# Patient Record
Sex: Female | Born: 1990 | Race: Black or African American | Hispanic: No | Marital: Single | Smoking: Current every day smoker
Health system: Southern US, Community
[De-identification: ages and names within clinical notes are randomized; demographics above are authoritative.]

## PROBLEM LIST (undated history)

## (undated) DIAGNOSIS — Z789 Other specified health status: Secondary | ICD-10-CM

## (undated) HISTORY — PX: INDUCED ABORTION: SHX677

---

## 2013-02-07 ENCOUNTER — Encounter (HOSPITAL_COMMUNITY): Payer: Self-pay | Admitting: *Deleted

## 2013-02-07 ENCOUNTER — Emergency Department (HOSPITAL_COMMUNITY): Payer: No Typology Code available for payment source

## 2013-02-07 ENCOUNTER — Emergency Department (HOSPITAL_COMMUNITY)
Admission: EM | Admit: 2013-02-07 | Discharge: 2013-02-07 | Disposition: A | Discharge status: Home / Self Care | Payer: No Typology Code available for payment source | Attending: Emergency Medicine | Admitting: Emergency Medicine

## 2013-02-07 DIAGNOSIS — Y939 Activity, unspecified: Secondary | ICD-10-CM | POA: Insufficient documentation

## 2013-02-07 DIAGNOSIS — S8990XA Unspecified injury of unspecified lower leg, initial encounter: Secondary | ICD-10-CM | POA: Insufficient documentation

## 2013-02-07 DIAGNOSIS — W19XXXA Unspecified fall, initial encounter: Secondary | ICD-10-CM

## 2013-02-07 DIAGNOSIS — R296 Repeated falls: Secondary | ICD-10-CM | POA: Insufficient documentation

## 2013-02-07 DIAGNOSIS — S99929A Unspecified injury of unspecified foot, initial encounter: Secondary | ICD-10-CM | POA: Insufficient documentation

## 2013-02-07 DIAGNOSIS — Y929 Unspecified place or not applicable: Secondary | ICD-10-CM | POA: Insufficient documentation

## 2013-02-07 MED ORDER — HYDROCODONE-ACETAMINOPHEN 5-325 MG PO TABS: 2.0000 | ORAL_TABLET | ORAL | Status: DC | PRN

## 2013-02-07 MED ORDER — NAPROXEN 500 MG PO TABS: 500.0000 mg | ORAL_TABLET | Freq: Two times a day (BID) | ORAL | Status: DC

## 2013-02-07 NOTE — ED Provider Notes (Signed)
History    This chart was scribed for non-physician practitioner working with Leonette Most B. Bernette Mayers, MD by Donne Anon, ED Scribe. This patient was seen in room WTR7/WTR7 and the patient's care was started at 1628.   CSN: [id number]  Arrival date & time 02/07/13  1628   First MD Initiated Contact with Patient 02/07/13 1831      Chief Complaint  Patient presents with  . Knee Pain     The history is provided by the patient and medical records. No language interpreter was used.   Pamela Wright is a 22 y.o. female who presents to the Emergency Department complaining of sudden onset, non changing, non radiating, moderate knee pain due to a fall that occurred Friday afternoon. She  fell straight on her knee cap. She has been ambulatory without difficulty since the fall, but has been resting it. She reports associated swelling. She denies numbness, tingling, decreased ROM of knee or any other pain. She reports nothing makes the pain worse. She has tried ice, elevation, Advil and rest with moderate relief.    History reviewed. No pertinent past medical history.  History reviewed. No pertinent past surgical history.  History reviewed. No pertinent family history.  History  Substance Use Topics  . Smoking status: Not on file  . Smokeless tobacco: Not on file  . Alcohol Use: Not on file    Review of Systems  Constitutional: Negative for fever, diaphoresis, appetite change, fatigue and unexpected weight change.  HENT: Negative for mouth sores and neck stiffness.   Eyes: Negative for visual disturbance.  Respiratory: Negative for cough, chest tightness, shortness of breath and wheezing.   Cardiovascular: Negative for chest pain.  Gastrointestinal: Negative for nausea, vomiting, abdominal pain, diarrhea and constipation.  Endocrine: Negative for polydipsia, polyphagia and polyuria.  Genitourinary: Negative for dysuria, urgency, frequency and hematuria.  Musculoskeletal: Positive for joint  swelling and arthralgias. Negative for back pain.  Skin: Negative for rash.  Allergic/Immunologic: Negative for immunocompromised state.  Neurological: Negative for syncope, light-headedness and headaches.  Hematological: Does not bruise/bleed easily.  Psychiatric/Behavioral: Negative for sleep disturbance. The patient is not nervous/anxious.   All other systems reviewed and are negative.    Allergies  Penicillins  Home Medications   Current Outpatient Rx  Name  Route  Sig  Dispense  Refill  . ibuprofen (ADVIL,MOTRIN) 200 MG tablet   Oral   Take 200 mg by mouth every 6 (six) hours as needed for pain.         . Multiple Vitamin (MULTIVITAMIN WITH MINERALS) TABS   Oral   Take 1 tablet by mouth daily.         Marland Kitchen HYDROcodone-acetaminophen (NORCO/VICODIN) 5-325 MG per tablet   Oral   Take 2 tablets by mouth every 4 (four) hours as needed for pain.   10 tablet   0   . naproxen (NAPROSYN) 500 MG tablet   Oral   Take 1 tablet (500 mg total) by mouth 2 (two) times daily with a meal.   30 tablet   0     Triage Vitals; BP 118/64  Pulse 82  Temp(Src) 98 F (36.7 C) (Oral)  Resp 20  SpO2 100%  LMP 01/26/2013  Physical Exam  Nursing note and vitals reviewed. Constitutional: She appears well-developed and well-nourished. No distress.  HENT:  Head: Normocephalic and atraumatic.  Mouth/Throat: Oropharynx is clear and moist. No oropharyngeal exudate.  Eyes: Conjunctivae are normal. No scleral icterus.  Neck: Normal range of motion.  Neck supple.  Cardiovascular: Normal rate, regular rhythm, normal heart sounds and intact distal pulses.  Exam reveals no gallop and no friction rub.   No murmur heard. Capillary refill < 3 sec  Pulmonary/Chest: Effort normal and breath sounds normal. No respiratory distress. She has no wheezes.  Abdominal: Soft. Bowel sounds are normal. She exhibits no mass. There is no tenderness. There is no rebound and no guarding.  Musculoskeletal: Normal  range of motion. She exhibits edema.       Right knee: She exhibits swelling, effusion and ecchymosis. She exhibits normal range of motion, no deformity, no laceration, no erythema, normal alignment, no LCL laxity and normal patellar mobility. Tenderness found. Medial joint line tenderness noted.  Good sensation, capillary refill, distal pulses bilaterally. Knee is slightly swollen with mild echinosis over the patella. Medial joint line tenderness of right knee. No pain on palpation of the patella.  Neurological: She is alert. GCS eye subscore is 4. GCS verbal subscore is 5. GCS motor subscore is 6.  Reflex Scores:      Tricep reflexes are 2+ on the right side and 2+ on the left side.      Bicep reflexes are 2+ on the right side and 2+ on the left side.      Brachioradialis reflexes are 2+ on the right side and 2+ on the left side.      Patellar reflexes are 2+ on the right side and 2+ on the left side.      Achilles reflexes are 2+ on the right side and 2+ on the left side. Speech is clear and goal oriented Moves extremities without ataxia Sensation intact Strength 5 out of 5 in the lower kidneys bilaterally  Skin: Skin is warm and dry. She is not diaphoretic.  Psychiatric: She has a normal mood and affect.    ED Course  Procedures (including critical care time) DIAGNOSTIC STUDIES: Oxygen Saturation is 100% on room air, norml by my interpretation.    COORDINATION OF CARE: 7:00 PM Discussed treatment plan which includes antiinflammatory medication, ice, elevation, rest and follow up with an orthopedist with pt at bedside and pt agreed to plan.     Labs Reviewed - No data to display Dg Knee Complete 4 Views Right  02/07/2013  *RADIOLOGY REPORT*  Clinical Data: Fall, knee pain.  RIGHT KNEE - COMPLETE 4+ VIEW  Comparison: None.  Findings: Irregular bony fragment is noted superior to the patella. Cannot exclude small avulsed fragment from superior pole of the patella.  Small joint effusion  present.  No additional acute bony abnormality.  IMPRESSION: Question small avulsed fragments from the superior pole of the patella.  Small joint effusion.   Original Report Authenticated By: Charlett Nose, M.D.      1. Knee injury   2. Fall       MDM  Noralee Space presents for knee pain after fall. X-ray with Question small avulsed fragments from the superior pole of the patella. Small joint effusion.  Discussed use of knee immobilizer and crutches with the patient who refuses both. She states she's having no problem walking and only use the crutches. I advised that she continue to wear her brace which she agrees to do. Also discussed rice protocol she also agrees to do. Recommend followup with orthopedics on Monday morning.  Patient seems reliable.  I have also discussed reasons to return immediately to the ER.  Patient expresses understanding and agrees with plan.  1. Medications: vicodin, Naprosyn, usual  home medications 2. Treatment: rest, drink plenty of fluids, elevate, ice, wear knee brace as instructed, recommend no weightbearing until evaluated by orthopedics 3. Follow Up: Please followup with your primary doctor for discussion of your diagnoses and further evaluation after today's visit; if you do not have a primary care doctor use the resource guide provided to find one; f/u with orthopedics as directed   I personally performed the services described in this documentation, which was scribed in my presence. The recorded information has been reviewed and is accurate.         Dierdre Forth, PA-C 02/07/13 1922

## 2013-02-07 NOTE — ED Notes (Addendum)
Pt in c/o right knee pain s/p fall on Friday, pt ambulatory at home

## 2013-02-07 NOTE — ED Provider Notes (Signed)
Medical screening examination/treatment/procedure(s) were performed by non-physician practitioner and as supervising physician I was immediately available for consultation/collaboration.   Charles B. Sheldon, MD 02/07/13 1930 

## 2013-04-29 ENCOUNTER — Encounter (HOSPITAL_COMMUNITY): Payer: Self-pay | Admitting: Emergency Medicine

## 2013-04-29 ENCOUNTER — Emergency Department (HOSPITAL_COMMUNITY)
Admission: EM | Admit: 2013-04-29 | Discharge: 2013-04-29 | Disposition: A | Discharge status: Home / Self Care | Payer: No Typology Code available for payment source | Attending: Emergency Medicine | Admitting: Emergency Medicine

## 2013-04-29 DIAGNOSIS — R300 Dysuria: Secondary | ICD-10-CM | POA: Insufficient documentation

## 2013-04-29 DIAGNOSIS — Z202 Contact with and (suspected) exposure to infections with a predominantly sexual mode of transmission: Secondary | ICD-10-CM | POA: Insufficient documentation

## 2013-04-29 DIAGNOSIS — Z880 Allergy status to penicillin: Secondary | ICD-10-CM | POA: Insufficient documentation

## 2013-04-29 DIAGNOSIS — Z3202 Encounter for pregnancy test, result negative: Secondary | ICD-10-CM | POA: Insufficient documentation

## 2013-04-29 DIAGNOSIS — Z711 Person with feared health complaint in whom no diagnosis is made: Secondary | ICD-10-CM

## 2013-04-29 DIAGNOSIS — Z79899 Other long term (current) drug therapy: Secondary | ICD-10-CM | POA: Insufficient documentation

## 2013-04-29 LAB — URINALYSIS, ROUTINE W REFLEX MICROSCOPIC
Glucose, UA: NEGATIVE mg/dL
Protein, ur: NEGATIVE mg/dL

## 2013-04-29 LAB — URINE MICROSCOPIC-ADD ON

## 2013-04-29 LAB — WET PREP, GENITAL: Yeast Wet Prep HPF POC: NONE SEEN

## 2013-04-29 MED ORDER — HYDROCODONE-ACETAMINOPHEN 5-325 MG PO TABS: 1.0000 | ORAL_TABLET | Freq: Four times a day (QID) | ORAL | Status: DC | PRN

## 2013-04-29 MED ORDER — AZITHROMYCIN 250 MG PO TABS
1000.0000 mg | ORAL_TABLET | Freq: Once | ORAL | Status: AC
  Administered 2013-04-29: 1000 mg via ORAL

## 2013-04-29 MED ORDER — CEFTRIAXONE SODIUM 250 MG IJ SOLR
250.0000 mg | Freq: Once | INTRAMUSCULAR | Status: AC
  Administered 2013-04-29: 250 mg via INTRAMUSCULAR
  Filled 2013-04-29: qty 250

## 2013-04-29 MED ORDER — LIDOCAINE HCL 2 % EX GEL
Freq: Once | CUTANEOUS | Status: AC
  Administered 2013-04-29: 05:00:00 via TOPICAL
  Filled 2013-04-29: qty 10

## 2013-04-29 MED ORDER — VALACYCLOVIR HCL 1 G PO TABS: 1000.0000 mg | ORAL_TABLET | Freq: Two times a day (BID) | ORAL | Status: DC

## 2013-04-29 MED ORDER — VALACYCLOVIR HCL 500 MG PO TABS
1000.0000 mg | ORAL_TABLET | Freq: Once | ORAL | Status: AC
  Administered 2013-04-29: 1000 mg via ORAL
  Filled 2013-04-29: qty 2

## 2013-04-29 NOTE — ED Provider Notes (Signed)
Medical screening examination/treatment/procedure(s) were performed by non-physician practitioner and as supervising physician I was immediately available for consultation/collaboration.  Jones Skene, M.D.   Jones Skene, MD 04/29/13 [phone]
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
## 2013-04-30 LAB — HERPES SIMPLEX VIRUS CULTURE

## 2013-05-02 ENCOUNTER — Telehealth (HOSPITAL_COMMUNITY): Payer: Self-pay | Admitting: Emergency Medicine

## 2013-05-02 NOTE — ED Notes (Signed)
+  Chlamydia. Patient treated with Rocephin and Zithromax. [employer] faxed. 

## 2013-05-02 NOTE — ED Notes (Signed)
Patient has +Chlamydia. 

## 2014-03-18 ENCOUNTER — Encounter (HOSPITAL_COMMUNITY): Payer: Self-pay | Admitting: Emergency Medicine

## 2014-03-18 ENCOUNTER — Emergency Department (HOSPITAL_COMMUNITY): Payer: No Typology Code available for payment source

## 2014-03-18 ENCOUNTER — Emergency Department (HOSPITAL_COMMUNITY)
Admission: EM | Admit: 2014-03-18 | Discharge: 2014-03-19 | Disposition: A | Discharge status: Home / Self Care | Payer: No Typology Code available for payment source | Attending: Emergency Medicine | Admitting: Emergency Medicine

## 2014-03-18 DIAGNOSIS — Y9389 Activity, other specified: Secondary | ICD-10-CM | POA: Insufficient documentation

## 2014-03-18 DIAGNOSIS — S8000XA Contusion of unspecified knee, initial encounter: Secondary | ICD-10-CM

## 2014-03-18 DIAGNOSIS — Y92410 Unspecified street and highway as the place of occurrence of the external cause: Secondary | ICD-10-CM | POA: Insufficient documentation

## 2014-03-18 DIAGNOSIS — S139XXA Sprain of joints and ligaments of unspecified parts of neck, initial encounter: Secondary | ICD-10-CM | POA: Insufficient documentation

## 2014-03-18 DIAGNOSIS — S161XXA Strain of muscle, fascia and tendon at neck level, initial encounter: Secondary | ICD-10-CM

## 2014-03-18 DIAGNOSIS — R110 Nausea: Secondary | ICD-10-CM | POA: Insufficient documentation

## 2014-03-18 DIAGNOSIS — Z880 Allergy status to penicillin: Secondary | ICD-10-CM | POA: Insufficient documentation

## 2014-03-18 MED ORDER — HYDROCODONE-ACETAMINOPHEN 5-325 MG PO TABS
1.0000 | ORAL_TABLET | Freq: Once | ORAL | Status: AC
  Administered 2014-03-18: 1 via ORAL
  Filled 2014-03-18: qty 1

## 2014-03-18 MED ORDER — IBUPROFEN 800 MG PO TABS
800.0000 mg | ORAL_TABLET | Freq: Once | ORAL | Status: AC
  Administered 2014-03-18: 800 mg via ORAL
  Filled 2014-03-18: qty 1
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
## 2014-03-19 MED ORDER — IBUPROFEN 800 MG PO TABS: 800.0000 mg | ORAL_TABLET | Freq: Three times a day (TID) | ORAL | Status: AC | PRN

## 2014-03-19 MED ORDER — HYDROCODONE-ACETAMINOPHEN 5-325 MG PO TABS: 1.0000 | ORAL_TABLET | Freq: Four times a day (QID) | ORAL | Status: AC | PRN

## 2014-03-19 NOTE — Discharge Instructions (Signed)
Return here as needed. Follow up with your doctor for a recheck. °

## 2014-03-19 NOTE — ED Provider Notes (Signed)
Medical screening examination/treatment/procedure(s) were performed by non-physician practitioner and as supervising physician I was immediately available for consultation/collaboration.    Nelia Shiobert L Naresh Althaus, MD 03/19/14 1520

## 2014-12-09 ENCOUNTER — Inpatient Hospital Stay (HOSPITAL_COMMUNITY)
Admission: AD | Admit: 2014-12-09 | Discharge: 2014-12-09 | Disposition: A | Discharge status: Home / Self Care | Payer: PRIVATE HEALTH INSURANCE | Source: Ambulatory Visit | Attending: Obstetrics & Gynecology | Admitting: Obstetrics & Gynecology

## 2014-12-09 ENCOUNTER — Encounter (HOSPITAL_COMMUNITY): Payer: Self-pay | Admitting: *Deleted

## 2014-12-09 DIAGNOSIS — N949 Unspecified condition associated with female genital organs and menstrual cycle: Secondary | ICD-10-CM | POA: Insufficient documentation

## 2014-12-09 DIAGNOSIS — B9689 Other specified bacterial agents as the cause of diseases classified elsewhere: Secondary | ICD-10-CM

## 2014-12-09 DIAGNOSIS — N760 Acute vaginitis: Secondary | ICD-10-CM

## 2014-12-09 DIAGNOSIS — A499 Bacterial infection, unspecified: Secondary | ICD-10-CM

## 2014-12-09 DIAGNOSIS — Z202 Contact with and (suspected) exposure to infections with a predominantly sexual mode of transmission: Secondary | ICD-10-CM | POA: Insufficient documentation

## 2014-12-09 HISTORY — DX: Other specified health status: Z78.9

## 2014-12-09 LAB — URINALYSIS, ROUTINE W REFLEX MICROSCOPIC
Bilirubin Urine: NEGATIVE
Glucose, UA: NEGATIVE mg/dL
Hgb urine dipstick: NEGATIVE
Ketones, ur: NEGATIVE mg/dL
LEUKOCYTES UA: NEGATIVE
NITRITE: NEGATIVE
PROTEIN: NEGATIVE mg/dL
SPECIFIC GRAVITY, URINE: 1.025 (ref 1.005–1.030)
UROBILINOGEN UA: 0.2 mg/dL (ref 0.0–1.0)
pH: 6 (ref 5.0–8.0)

## 2014-12-09 LAB — WET PREP, GENITAL
Trich, Wet Prep: NONE SEEN
Yeast Wet Prep HPF POC: NONE SEEN

## 2014-12-09 LAB — POCT PREGNANCY, URINE: Preg Test, Ur: NEGATIVE

## 2014-12-09 MED ORDER — METRONIDAZOLE 500 MG PO TABS: 500.0000 mg | ORAL_TABLET | Freq: Two times a day (BID) | ORAL | Status: DC

## 2014-12-09 MED ORDER — AZITHROMYCIN 250 MG PO TABS
1000.0000 mg | ORAL_TABLET | Freq: Once | ORAL | Status: AC
  Administered 2014-12-09: 1000 mg via ORAL
  Filled 2014-12-09: qty 4

## 2014-12-09 NOTE — MAU Provider Note (Signed)
  History     CSN: [id number]  Arrival date and time: 12/09/14 1618   None     Chief Complaint  Patient presents with  . Pelvic Pain   Patient is a 23 y.o. female presenting with pelvic pain. The history is provided by the patient.  Pelvic Pain This is a new problem. The current episode started 1 to 4 weeks ago. The problem occurs constantly. The problem has been unchanged. Nothing aggravates the symptoms. She has tried NSAIDs for the symptoms. The treatment provided mild relief.   Pamela Wright is a.age female who presents to the MAU for STI testing. She states that her sex partner told her he had Chlamydia. She has been having pressure in her pelvic area for the past couple weeks and wants to know if she is infected.   Pertinent Gynecological History: Bleeding: menses last 5 days moderate flow regular Contraception: condoms DES exposure: denies Blood transfusions: none Sexually transmitted diseases: HSV Previous GYN Procedures: none  Last mammogram: n/a Date:  Last pap: never Date:    Past Medical History  Diagnosis Date  . Medical history non-contributory     Past Surgical History  Procedure Laterality Date  . No past surgeries      No family history on file.  History  Substance Use Topics  . Smoking status: Never Smoker   . Smokeless tobacco: Not on file  . Alcohol Use: Yes     Comment: occ    Allergies:  Allergies  Allergen Reactions  . Penicillins Other (See Comments)    "childhood allergy"    No prescriptions prior to admission    Review of Systems  Genitourinary: Positive for pelvic pain.       STI exposure   All other systems negative.   Blood pressure 110/68, pulse 70, temperature 98.5 F (36.9 C), temperature source Oral, resp. rate 18, height 5\' 6"  (1.676 m), weight 147 lb 6.4 oz (66.86 kg), last menstrual period 11/23/2014.  Physical Exam  Nursing note and vitals reviewed. Constitutional: She is oriented to person, place, and time.  She appears well-developed and well-nourished. No distress.  HENT:  Head: Normocephalic and atraumatic.  Eyes: EOM are normal.  Neck: Neck supple.  Cardiovascular: Normal rate.   Respiratory: Effort normal.  GI: Soft. There is no tenderness.  Genitourinary:  External genitalia without lesions. Frothy d/c vaginal vault. No CMT, no adnexal tenderness, uterus without palpable enlargement.   Musculoskeletal: Normal range of motion.  Neurological: She is alert and oriented to person, place, and time.  Skin: Skin is warm and dry.  Psychiatric: She has a normal mood and affect. Her behavior is normal. Judgment and thought content normal.    MAU Course  Procedures  MDM [facility] and would like to go ahead an be treated for Chlamydia. She does not want HIV or Syphilis testing and does not want treatment for GC unless culture positive.   Assessment and Plan  23 y.o. female with STI exposure and request for STI testing. Stable for discharge without pain. Treated with Zithromax 1 gram PO prior to d/c. Discussed with the patient plan of care and all questioned fully answered. She will return if any problems arise.   Pamela Wright 12/09/2014, 9:16 PM

## 2014-12-09 NOTE — Discharge Instructions (Signed)
Bacterial Vaginosis Bacterial vaginosis is a vaginal infection that occurs when the normal balance of bacteria in the vagina is disrupted. It results from an overgrowth of certain bacteria. This is the most common vaginal infection in women of childbearing age. Treatment is important to prevent complications, especially in pregnant women, as it can cause a premature delivery. CAUSES  Bacterial vaginosis is caused by an increase in harmful bacteria that are normally present in smaller amounts in the vagina. Several different kinds of bacteria can cause bacterial vaginosis. However, the reason that the condition develops is not fully understood. RISK FACTORS Certain activities or behaviors can put you at an increased risk of developing bacterial vaginosis, including:  Having a new sex partner or multiple sex partners.  Douching.  Using an intrauterine device (IUD) for contraception. Women do not get bacterial vaginosis from toilet seats, bedding, swimming pools, or contact with objects around them. SIGNS AND SYMPTOMS  Some women with bacterial vaginosis have no signs or symptoms. Common symptoms include:  Grey vaginal discharge.  A fishlike odor with discharge, especially after sexual intercourse.  Itching or burning of the vagina and vulva.  Burning or pain with urination. DIAGNOSIS  Your health care provider will take a medical history and examine the vagina for signs of bacterial vaginosis. A sample of vaginal fluid may be taken. Your health care provider will look at this sample under a microscope to check for bacteria and abnormal cells. A vaginal pH test may also be done.  TREATMENT  Bacterial vaginosis may be treated with antibiotic medicines. These may be given in the form of a pill or a vaginal cream. A second round of antibiotics may be prescribed if the condition comes back after treatment.  HOME CARE INSTRUCTIONS   Only take over-the-counter or prescription medicines as  directed by your health care provider.  If antibiotic medicine was prescribed, take it as directed. Make sure you finish it even if you start to feel better.  Do not have sex until treatment is completed.  Tell all sexual partners that you have a vaginal infection. They should see their health care provider and be treated if they have problems, such as a mild rash or itching.  Practice safe sex by using condoms and only having one sex partner. SEEK MEDICAL CARE IF:   Your symptoms are not improving after 3 days of treatment.  You have increased discharge or pain.  You have a fever. MAKE SURE YOU:   Understand these instructions.  Will watch your condition.  Will get help right away if you are not doing well or get worse. FOR MORE INFORMATION  Centers for Disease Control and Prevention, Division of STD Prevention: www.cdc.gov/std American Sexual Health Association (ASHA): www.ashastd.org  Document Released: 12/09/2005 Document Revised: 09/29/2013 Document Reviewed: 07/21/2013 [employer] Patient Information 2015 [employer]. This information is not intended to replace advice given to you by your health care provider. Make sure you discuss any questions you have with your health care provider.  

## 2014-12-09 NOTE — MAU Note (Signed)
Pelvic discomfort for the past 2 weeks, urine has been cloudy.  Sexual partner told pt he had "something" & that she needed to be checked out.

## 2014-12-10 LAB — GC/CHLAMYDIA PROBE AMP
CT PROBE, AMP APTIMA: POSITIVE — AB
GC PROBE AMP APTIMA: NEGATIVE

## 2014-12-12 ENCOUNTER — Encounter: Payer: Self-pay | Admitting: Obstetrics & Gynecology

## 2014-12-12 ENCOUNTER — Telehealth: Payer: Self-pay | Admitting: *Deleted

## 2014-12-12 ENCOUNTER — Telehealth: Payer: Self-pay

## 2014-12-12 ENCOUNTER — Other Ambulatory Visit: Payer: Self-pay | Admitting: Obstetrics & Gynecology

## 2014-12-12 DIAGNOSIS — A749 Chlamydial infection, unspecified: Secondary | ICD-10-CM

## 2014-12-12 MED ORDER — AZITHROMYCIN 500 MG PO TABS: 1000.0000 mg | ORAL_TABLET | Freq: Once | ORAL | Status: DC

## 2014-12-12 NOTE — Telephone Encounter (Signed)
Zithromax 1gm e-prescribed to patient's pharmacy per protocol. Attempted to contact patient. No answer. Left message stating we are calling with results and of information of a RX that has been sent to your pharmacy, please call clinic. STD card faxed to health department.

## 2014-12-12 NOTE — Telephone Encounter (Signed)
-----   Message from Lesly DukesKelly H Leggett, MD sent at 12/12/2014  2:55 PM EST ----- +chlamydia.  Treat per protocol and refer partners to health dept.

## 2014-12-12 NOTE — Telephone Encounter (Signed)
Telephone call to patient regarding positive chlamydia culture, patient notified.  Patient was treated at time of visit.  Instructed patient to notify her partner for treatment.  Report faxed to health department.

## 2014-12-13 NOTE — Telephone Encounter (Signed)
Attempted to contact patient regarding results. No answer. Left message stating we are calling with results, please call clinic. Per chart review patient was already treated for Chlamydia at MAU visit per her request. No further treatment necessary. RX for Zithromax discontinued. Pharmacy informed. Certified letter sent informing patient of positive results, however, explained no further treatment is necessary.

## 2014-12-13 NOTE — Telephone Encounter (Signed)
Patient called back to front office and I informed her of results and that no other treatment was needed at this time since she had received treatment yesterday. Reminded patient that her partner(s) will need treatment as well and to abstain 2 weeks after intercourse. Patient verbalized understanding and had no other questions

## 2014-12-26 ENCOUNTER — Encounter: Payer: Self-pay | Admitting: *Deleted

## 2015-01-03 ENCOUNTER — Encounter (HOSPITAL_COMMUNITY): Payer: Self-pay | Admitting: *Deleted

## 2015-01-03 ENCOUNTER — Inpatient Hospital Stay (HOSPITAL_COMMUNITY)
Admission: AD | Admit: 2015-01-03 | Discharge: 2015-01-03 | Disposition: A | Discharge status: Home / Self Care | Payer: No Typology Code available for payment source | Source: Ambulatory Visit | Attending: Obstetrics & Gynecology | Admitting: Obstetrics & Gynecology

## 2015-01-03 DIAGNOSIS — N949 Unspecified condition associated with female genital organs and menstrual cycle: Secondary | ICD-10-CM | POA: Insufficient documentation

## 2015-01-03 DIAGNOSIS — R109 Unspecified abdominal pain: Secondary | ICD-10-CM | POA: Diagnosis present

## 2015-01-03 DIAGNOSIS — R102 Pelvic and perineal pain: Secondary | ICD-10-CM

## 2015-01-03 DIAGNOSIS — J069 Acute upper respiratory infection, unspecified: Secondary | ICD-10-CM | POA: Insufficient documentation

## 2015-01-03 DIAGNOSIS — F17210 Nicotine dependence, cigarettes, uncomplicated: Secondary | ICD-10-CM | POA: Insufficient documentation

## 2015-01-03 LAB — URINALYSIS, ROUTINE W REFLEX MICROSCOPIC
Bilirubin Urine: NEGATIVE
Glucose, UA: NEGATIVE mg/dL
KETONES UR: NEGATIVE mg/dL
LEUKOCYTES UA: NEGATIVE
Nitrite: NEGATIVE
PROTEIN: NEGATIVE mg/dL
Specific Gravity, Urine: 1.02 (ref 1.005–1.030)
UROBILINOGEN UA: 0.2 mg/dL (ref 0.0–1.0)
pH: 6 (ref 5.0–8.0)

## 2015-01-03 LAB — URINE MICROSCOPIC-ADD ON

## 2015-01-03 LAB — WET PREP, GENITAL
Clue Cells Wet Prep HPF POC: NONE SEEN
Trich, Wet Prep: NONE SEEN
Yeast Wet Prep HPF POC: NONE SEEN

## 2015-01-03 LAB — POCT PREGNANCY, URINE: PREG TEST UR: NEGATIVE

## 2015-01-03 NOTE — MAU Note (Signed)
Pt C/O runny nose & cold 3-4 days ago, lower abd pressure for 2 weeks.  Has noted cloudy urine for the past 2 days, started having diarrhea today.

## 2015-01-03 NOTE — Discharge Instructions (Signed)
Sexually Transmitted Disease °A sexually transmitted disease (STD) is a disease or infection that may be passed (transmitted) from person to person, usually during sexual activity. This may happen by way of saliva, semen, blood, vaginal mucus, or urine. Common STDs include:  °· Gonorrhea.   °· Chlamydia.   °· Syphilis.   °· HIV and AIDS.   °· Genital herpes.   °· Hepatitis B and C.   °· Trichomonas.   °· Human papillomavirus (HPV).   °· Pubic lice.   °· Scabies. °· Mites. °· Bacterial vaginosis. °WHAT ARE CAUSES OF STDs? °An STD may be caused by bacteria, a virus, or parasites. STDs are often transmitted during sexual activity if one person is infected. However, they may also be transmitted through nonsexual means. STDs may be transmitted after:  °· Sexual intercourse with an infected person.   °· Sharing sex toys with an infected person.   °· Sharing needles with an infected person or using unclean piercing or tattoo needles. °· Having intimate contact with the genitals, mouth, or rectal areas of an infected person.   °· Exposure to infected fluids during birth. °WHAT ARE THE SIGNS AND SYMPTOMS OF STDs? °Different STDs have different symptoms. Some people may not have any symptoms. If symptoms are present, they may include:  °· Painful or bloody urination.   °· Pain in the pelvis, abdomen, vagina, anus, throat, or eyes.   °· A skin rash, itching, or irritation. °· Growths, ulcerations, blisters, or sores in the genital and anal areas. °· Abnormal vaginal discharge with or without bad odor.   °· Penile discharge in men.   °· Fever.   °· Pain or bleeding during sexual intercourse.   °· Swollen glands in the groin area.   °· Yellow skin and eyes (jaundice). This is seen with hepatitis.   °· Swollen testicles. °· Infertility. °· Sores and blisters in the mouth. °HOW ARE STDs DIAGNOSED? °To make a diagnosis, your health care provider may:  °· Take a medical history.   °· Perform a physical exam.   °· Take a sample of  any discharge to examine. °· Swab the throat, cervix, opening to the penis, rectum, or vagina for testing. °· Test a sample of your first morning urine.   °· Perform blood tests.   °· Perform a Pap test, if this applies.   °· Perform a colposcopy.   °· Perform a laparoscopy.   °HOW ARE STDs TREATED? ° Treatment depends on the STD. Some STDs may be treated but not cured.  °· Chlamydia, gonorrhea, trichomonas, and syphilis can be cured with antibiotic medicine.   °· Genital herpes, hepatitis, and HIV can be treated, but not cured, with prescribed medicines. The medicines lessen symptoms.   °· Genital warts from HPV can be treated with medicine or by freezing, burning (electrocautery), or surgery. Warts may come back.   °· HPV cannot be cured with medicine or surgery. However, abnormal areas may be removed from the cervix, vagina, or vulva.   °· If your diagnosis is confirmed, your recent sexual partners need treatment. This is true even if they are symptom-free or have a negative culture or evaluation. They should not have sex until their health care providers say it is okay. °HOW CAN I REDUCE MY RISK OF GETTING AN STD? °Take these steps to reduce your risk of getting an STD: °· Use latex condoms, dental dams, and water-soluble lubricants during sexual activity. Do not use petroleum jelly or oils. °· Avoid having multiple sex partners. °· Do not have sex with someone who has other sex partners. °· Do not have sex with anyone you do not know or who is at   high risk for an STD.  Avoid risky sex practices that can break your skin.  Do not have sex if you have open sores on your mouth or skin.  Avoid drinking too much alcohol or taking illegal drugs. Alcohol and drugs can affect your judgment and put you in a vulnerable position.  Avoid engaging in oral and anal sex acts.  Get vaccinated for HPV and hepatitis. If you have not received these vaccines in the past, talk to your health care provider about whether one  or both might be right for you.   If you are at risk of being infected with HIV, it is recommended that you take a prescription medicine daily to prevent HIV infection. This is called pre-exposure prophylaxis (PrEP). You are considered at risk if:  You are a man who has sex with other men (MSM).  You are a heterosexual man or woman and are sexually active with more than one partner.  You take drugs by injection.  You are sexually active with a partner who has HIV.  Talk with your health care provider about whether you are at high risk of being infected with HIV. If you choose to begin PrEP, you should first be tested for HIV. You should then be tested every 3 months for as long as you are taking PrEP.  WHAT SHOULD I DO IF I THINK I HAVE AN STD?  See your health care provider.   Tell your sexual partner(s). They should be tested and treated for any STDs.  Do not have sex until your health care provider says it is okay. WHEN SHOULD I GET IMMEDIATE MEDICAL CARE? Contact your health care provider right away if:   You have severe abdominal pain.  You are a man and notice swelling or pain in your testicles.  You are a woman and notice swelling or pain in your vagina. Document Released: 03/01/2003 Document Revised: 12/14/2013 Document Reviewed: 06/29/2013 [facility] Patient Information 2015 [place], [place]. This information is not intended to replace advice given to you by your health care provider. Make sure you discuss any questions you have with your health care provider. Upper Respiratory Infection, Adult An upper respiratory infection (URI) is also sometimes known as the common cold. The upper respiratory tract includes the nose, sinuses, throat, trachea, and bronchi. Bronchi are the airways leading to the lungs. Most people improve within 1 week, but symptoms can last up to 2 weeks. A residual cough may last even longer.  CAUSES Many different viruses can infect the tissues lining  the upper respiratory tract. The tissues become irritated and inflamed and often become very moist. Mucus production is also common. A cold is contagious. You can easily spread the virus to others by oral contact. This includes kissing, sharing a glass, coughing, or sneezing. Touching your mouth or nose and then touching a surface, which is then touched by another person, can also spread the virus. SYMPTOMS  Symptoms typically develop 1 to 3 days after you come in contact with a cold virus. Symptoms vary from person to person. They may include:  Runny nose.  Sneezing.  Nasal congestion.  Sinus irritation.  Sore throat.  Loss of voice (laryngitis).  Cough.  Fatigue.  Muscle aches.  Loss of appetite.  Headache.  Low-grade fever. DIAGNOSIS  You might diagnose your own cold based on familiar symptoms, since most people get a cold 2 to 3 times a year. Your caregiver can confirm this based on your exam. Most importantly,  your caregiver can check that your symptoms are not due to another disease such as strep throat, sinusitis, pneumonia, asthma, or epiglottitis. Blood tests, throat tests, and X-rays are not necessary to diagnose a common cold, but they may sometimes be helpful in excluding other more serious diseases. Your caregiver will decide if any further tests are required. RISKS AND COMPLICATIONS  You may be at risk for a more severe case of the common cold if you smoke cigarettes, have chronic heart disease (such as heart failure) or lung disease (such as asthma), or if you have a weakened immune system. The very young and very old are also at risk for more serious infections. Bacterial sinusitis, middle ear infections, and bacterial pneumonia can complicate the common cold. The common cold can worsen asthma and chronic obstructive pulmonary disease (COPD). Sometimes, these complications can require emergency medical care and may be life-threatening. PREVENTION  The best way to  protect against getting a cold is to practice good hygiene. Avoid oral or hand contact with people with cold symptoms. Wash your hands often if contact occurs. There is no clear evidence that vitamin C, vitamin E, echinacea, or exercise reduces the chance of developing a cold. However, it is always recommended to get plenty of rest and practice good nutrition. TREATMENT  Treatment is directed at relieving symptoms. There is no cure. Antibiotics are not effective, because the infection is caused by a virus, not by bacteria. Treatment may include:  Increased fluid intake. Sports drinks offer valuable electrolytes, sugars, and fluids.  Breathing heated mist or steam (vaporizer or shower).  Eating chicken soup or other clear broths, and maintaining good nutrition.  Getting plenty of rest.  Using gargles or lozenges for comfort.  Controlling fevers with ibuprofen or acetaminophen as directed by your caregiver.  Increasing usage of your inhaler if you have asthma. Zinc gel and zinc lozenges, taken in the first 24 hours of the common cold, can shorten the duration and lessen the severity of symptoms. Pain medicines may help with fever, muscle aches, and throat pain. A variety of non-prescription medicines are available to treat congestion and runny nose. Your caregiver can make recommendations and may suggest nasal or lung inhalers for other symptoms.  HOME CARE INSTRUCTIONS   Only take over-the-counter or prescription medicines for pain, discomfort, or fever as directed by your caregiver.  Use a warm mist humidifier or inhale steam from a shower to increase air moisture. This may keep secretions moist and make it easier to breathe.  Drink enough water and fluids to keep your urine clear or pale yellow.  Rest as needed.  Return to work when your temperature has returned to normal or as your caregiver advises. You may need to stay home longer to avoid infecting others. You can also use a face  mask and careful hand washing to prevent spread of the virus. SEEK MEDICAL CARE IF:   After the first few days, you feel you are getting worse rather than better.  You need your caregiver's advice about medicines to control symptoms.  You develop chills, worsening shortness of breath, or brown or red sputum. These may be signs of pneumonia.  You develop yellow or brown nasal discharge or pain in the face, especially when you bend forward. These may be signs of sinusitis.  You develop a fever, swollen neck glands, pain with swallowing, or white areas in the back of your throat. These may be signs of strep throat. SEEK IMMEDIATE MEDICAL CARE IF:  You have a fever.  You develop severe or persistent headache, ear pain, sinus pain, or chest pain.  You develop wheezing, a prolonged cough, cough up blood, or have a change in your usual mucus (if you have chronic lung disease).  You develop sore muscles or a stiff neck. Document Released: 06/04/2001 Document Revised: 03/02/2012 Document Reviewed: 03/16/2014 [facility] Patient Information 2015 [place], [place]. This information is not intended to replace advice given to you by your health care provider. Make sure you discuss any questions you have with your health care provider.

## 2015-01-03 NOTE — MAU Provider Note (Signed)
Chief Complaint: Abdominal Pain and Diarrhea   First Provider Initiated Contact with Patient 01/03/15 1457     SUBJECTIVE HPI: Pamela Wright is a 24 y.o. [id number] who presents to maternity admissions reporting:  1. Cold symptoms of runny nose, mild sore throat, cough x 4 days.  Denies fever, chills, SOB, chronic respiratory disease.  tx w/benadryl and robutissin, no relief.  2. Abdominal pressure in the suprapubic area  x 2 wks. Endorses slight d/c but typical per patient, mild cramps. Denies vag bleeding, back pain, dysuria, frequency/urgency. Reports cloudy urine. This morning noticed "diarrhea": 2 soft BM today. Has not tried any treatment. LMP 12/19/2014, with usual menses lasting 4-5 days, mild-moderate flow, occur q28 days. Was treated for chlamydia 12/14/2014 and is unsure of STD exposure since. No birth control used, sexually active. Last pregnancy 3 years ago, terminated.    Past Medical History  Diagnosis Date  . Medical history non-contributory    Past Surgical History  Procedure Laterality Date  . Induced abortion     History   Social History  . Marital Status: Single    Spouse Name: N/A    Number of Children: N/A  . Years of Education: N/A   Occupational History  . Not on file.   Social History Main Topics  . Smoking status: Current Every Day Smoker  . Smokeless tobacco: Not on file  . Alcohol Use: Yes     Comment: occassional  . Drug Use: Yes    Special: Marijuana  . Sexual Activity: Yes    Birth Control/ Protection: Condom   Other Topics Concern  . Not on file   Social History Narrative   No current facility-administered medications on file prior to encounter.   Current Outpatient Prescriptions on File Prior to Encounter  Medication Sig Dispense Refill  . ibuprofen (ADVIL,MOTRIN) 200 MG tablet Take 400 mg by mouth every 6 (six) hours as needed.    Marland Kitchen. HYDROcodone-acetaminophen (NORCO/VICODIN) 5-325 MG per tablet Take 1 tablet by mouth every 6 (six) hours  as needed for pain. (Patient not taking: Reported on 01/03/2015) 15 tablet 0  . metroNIDAZOLE (FLAGYL) 500 MG tablet Take 1 tablet (500 mg total) by mouth 2 (two) times daily. (Patient not taking: Reported on 01/03/2015) 14 tablet 0  . valACYclovir (VALTREX) 1000 MG tablet Take 1 tablet (1,000 mg total) by mouth 2 (two) times daily. (Patient not taking: Reported on 12/09/2014) 20 tablet 0   Allergies  Allergen Reactions  . Penicillins Other (See Comments)    "childhood allergy"    ROS: Pertinent items in HPI  OBJECTIVE Blood pressure 130/75, pulse 105, temperature 98.4 F (36.9 C), temperature source Oral, resp. rate 18, height 5\' 6"  (1.676 m), weight 145 lb 12.8 oz (66.134 kg), last menstrual period 12/20/2014. GENERAL: Well-developed, well-nourished female in no acute distress.  HEENT: Normocephalic. Moist and pink buccal mucosa. Mild pharyngeal erythema, no exudate or edema noted. Nasal mucosa pink, no discharge noted. Nasal congestion with decreased patency of left nostril. No tenderness over maxillary or frontal sinuses. Conjunctivae pink. HEART: normal rate, RESP: normal effort, lungs CTA bilaterally ABDOMEN: Soft, mild tenderness in LLQ on deep palpation. No rebound or guarding. No CVA tenderness.  NEURO: Alert and oriented.  SPECULUM EXAM: External exam nonpathologic. Vaginal walls nonedematous, nonerythematous. Mild amount of thin, white-to-yellowish discharge present. Cervix non-friable. No vaginal or cervical bleeding.  BIMANUAL: Uterus normal size, no adnexal tenderness or masses. No adnexal or CM tenderness.   LAB RESULTS Results for orders placed  or performed during the hospital encounter of 01/03/15 (from the past 24 hour(s))  Urinalysis, Routine w reflex microscopic     Status: Abnormal   Collection Time: 01/03/15  1:00 PM  Result Value Ref Range   Color, Urine YELLOW YELLOW   APPearance CLEAR CLEAR   Specific Gravity, Urine 1.020 1.005 - 1.030   pH 6.0 5.0 - 8.0    Glucose, UA NEGATIVE NEGATIVE mg/dL   Hgb urine dipstick TRACE (A) NEGATIVE   Bilirubin Urine NEGATIVE NEGATIVE   Ketones, ur NEGATIVE NEGATIVE mg/dL   Protein, ur NEGATIVE NEGATIVE mg/dL   Urobilinogen, UA 0.2 0.0 - 1.0 mg/dL   Nitrite NEGATIVE NEGATIVE   Leukocytes, UA NEGATIVE NEGATIVE  Urine microscopic-add on     Status: Abnormal   Collection Time: 01/03/15  1:00 PM  Result Value Ref Range   Squamous Epithelial / LPF FEW (A) RARE   WBC, UA 0-2 <3 WBC/hpf   RBC / HPF 0-2 <3 RBC/hpf   Bacteria, UA RARE RARE   Urine-Other AMORPHOUS URATES/PHOSPHATES   Pregnancy, urine POC     Status: None   Collection Time: 01/03/15  1:24 PM  Result Value Ref Range   Preg Test, Ur NEGATIVE NEGATIVE  Wet prep, genital     Status: Abnormal   Collection Time: 01/03/15  3:27 PM  Result Value Ref Range   Yeast Wet Prep HPF POC NONE SEEN NONE SEEN   Trich, Wet Prep NONE SEEN NONE SEEN   Clue Cells Wet Prep HPF POC NONE SEEN NONE SEEN   WBC, Wet Prep HPF POC FEW (A) NONE SEEN    IMAGING No results found.  ASSESSMENT 1. Acute upper respiratory infection   2. Pelvic pain in female     PLAN Discharge home.  1. Provided information for at home care for URI and reasons to seek additional care.  2. STD labs pending, will notify patient if results positive to initiate appropriate treatment. Provided materials about STD prevention.   Advised on establishing GYN or PCP care in the area.      Medication List    ASK your doctor about these medications        diphenhydrAMINE 25 MG tablet  Commonly known as:  BENADRYL  Take 25 mg by mouth every 6 (six) hours as needed for allergies.     HYDROcodone-acetaminophen 5-325 MG per tablet  Commonly known as:  NORCO/VICODIN  Take 1 tablet by mouth every 6 (six) hours as needed for pain.     ibuprofen 200 MG tablet  Commonly known as:  ADVIL,MOTRIN  Take 400 mg by mouth every 6 (six) hours as needed.     metroNIDAZOLE 500 MG tablet  Commonly known  as:  FLAGYL  Take 1 tablet (500 mg total) by mouth 2 (two) times daily.     valACYclovir 1000 MG tablet  Commonly known as:  VALTREX  Take 1 tablet (1,000 mg total) by mouth 2 (two) times daily.         Tobi Bastos Misior, PA-S 01/03/2015 4:34 PM   I have seen this patient and agree with the above PA student's note.  Sharen Counter Certified Nurse-Midwife 01/03/2015  4:34 PM

## 2015-01-03 NOTE — MAU Note (Signed)
Pt denies dysuria or frequency.

## 2015-01-04 LAB — URINE CULTURE
Colony Count: NO GROWTH
Culture: NO GROWTH

## 2015-01-04 LAB — GC/CHLAMYDIA PROBE AMP
CT PROBE, AMP APTIMA: NEGATIVE
GC Probe RNA: NEGATIVE

## 2015-01-04 LAB — HIV ANTIBODY (ROUTINE TESTING W REFLEX)
HIV 1/O/2 Abs-Index Value: <1 (ref <1.00)
HIV-1/HIV-2 Ab: NONREACTIVE
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
## 2015-04-05 ENCOUNTER — Inpatient Hospital Stay (HOSPITAL_COMMUNITY)
Admission: EM | Admit: 2015-04-05 | Discharge: 2015-04-05 | Disposition: A | Discharge status: Home / Self Care | Payer: PRIVATE HEALTH INSURANCE | Source: Ambulatory Visit | Attending: Obstetrics & Gynecology | Admitting: Obstetrics & Gynecology

## 2015-04-05 ENCOUNTER — Encounter (HOSPITAL_COMMUNITY): Payer: Self-pay

## 2015-04-05 ENCOUNTER — Emergency Department (HOSPITAL_COMMUNITY)
Admission: EM | Admit: 2015-04-05 | Discharge: 2015-04-05 | Disposition: A | Discharge status: Home / Self Care | Payer: Self-pay | Source: Home / Self Care

## 2015-04-05 DIAGNOSIS — B9689 Other specified bacterial agents as the cause of diseases classified elsewhere: Secondary | ICD-10-CM | POA: Diagnosis not present

## 2015-04-05 DIAGNOSIS — N898 Other specified noninflammatory disorders of vagina: Secondary | ICD-10-CM | POA: Insufficient documentation

## 2015-04-05 DIAGNOSIS — F17210 Nicotine dependence, cigarettes, uncomplicated: Secondary | ICD-10-CM | POA: Diagnosis not present

## 2015-04-05 DIAGNOSIS — R109 Unspecified abdominal pain: Secondary | ICD-10-CM | POA: Diagnosis present

## 2015-04-05 DIAGNOSIS — A499 Bacterial infection, unspecified: Secondary | ICD-10-CM | POA: Diagnosis not present

## 2015-04-05 DIAGNOSIS — N760 Acute vaginitis: Secondary | ICD-10-CM | POA: Diagnosis not present

## 2015-04-05 LAB — URINALYSIS, ROUTINE W REFLEX MICROSCOPIC
BILIRUBIN URINE: NEGATIVE
Glucose, UA: NEGATIVE mg/dL
HGB URINE DIPSTICK: NEGATIVE
Ketones, ur: NEGATIVE mg/dL
Leukocytes, UA: NEGATIVE
Nitrite: NEGATIVE
PH: 5.5 (ref 5.0–8.0)
Protein, ur: NEGATIVE mg/dL
Specific Gravity, Urine: >1.03 — ABNORMAL HIGH (ref 1.005–1.030)
UROBILINOGEN UA: 0.2 mg/dL (ref 0.0–1.0)

## 2015-04-05 LAB — WET PREP, GENITAL
TRICH WET PREP: NONE SEEN
YEAST WET PREP: NONE SEEN

## 2015-04-05 LAB — CBC
HCT: 38.5 % (ref 36.0–46.0)
Hemoglobin: 13.2 g/dL (ref 12.0–15.0)
MCH: 32 pg (ref 26.0–34.0)
MCHC: 34.3 g/dL (ref 30.0–36.0)
MCV: 93.4 fL (ref 78.0–100.0)
PLATELETS: 293 10*3/uL (ref 150–400)
RBC: 4.12 MIL/uL (ref 3.87–5.11)
RDW: 12.4 % (ref 11.5–15.5)
WBC: 5.4 10*3/uL (ref 4.0–10.5)

## 2015-04-05 LAB — POCT PREGNANCY, URINE: Preg Test, Ur: NEGATIVE

## 2015-04-05 NOTE — MAU Provider Note (Signed)
History     CSN: [id number]  Arrival date and time: 04/05/15 1119   First Provider Initiated Contact with Patient 04/05/15 1404      Chief Complaint  Patient presents with  . Abdominal Pain  . Vaginal Discharge   Pamela Wright is a 24 y.o. female [id number] at Unknown who presents with abdominal pain and vaginal discharge.  The discharge started 2-3 days ago and has been unchanged in severity over the past few days. The discharge is clear-white, +odor. She has not tried anything over the counter for the symptoms. No new sexual partners, she does use condoms during intercourse.    Abdominal Pain This is a new problem. The current episode started in the past 7 days. The onset quality is gradual. The problem occurs daily. The problem has been unchanged. The pain is located in the suprapubic region. The pain is at a severity of 0/10. The patient is experiencing no pain. The quality of the pain is aching and cramping. The abdominal pain does not radiate. Pertinent negatives include no anorexia, constipation, diarrhea or fever. Nothing aggravates the pain. The pain is relieved by nothing. She has tried nothing for the symptoms.    OB History    Gravida Para Term Preterm AB TAB SAB Ectopic Multiple Living   1    1     0      Past Medical History  Diagnosis Date  . Medical history non-contributory     Past Surgical History  Procedure Laterality Date  . Induced abortion      No family history on file.  History  Substance Use Topics  . Smoking status: Current Every Day Smoker  . Smokeless tobacco: Not on file  . Alcohol Use: Yes     Comment: occassional    Allergies:  Allergies  Allergen Reactions  . Penicillins Other (See Comments)    "childhood allergy"    Prescriptions prior to admission  Medication Sig Dispense Refill Last Dose  . diphenhydrAMINE (BENADRYL) 25 MG tablet Take 25 mg by mouth every 6 (six) hours as needed for allergies.   prn  . ibuprofen  (ADVIL,MOTRIN) 200 MG tablet Take 400 mg by mouth every 6 (six) hours as needed for headache or mild pain.    prn   Results for orders placed or performed during the hospital encounter of 04/05/15 (from the past 48 hour(s))  Urinalysis, Routine w reflex microscopic     Status: Abnormal   Collection Time: 04/05/15 11:45 AM  Result Value Ref Range   Color, Urine YELLOW YELLOW   APPearance CLEAR CLEAR   Specific Gravity, Urine >1.030 (H) 1.005 - 1.030   pH 5.5 5.0 - 8.0   Glucose, UA NEGATIVE NEGATIVE mg/dL   Hgb urine dipstick NEGATIVE NEGATIVE   Bilirubin Urine NEGATIVE NEGATIVE   Ketones, ur NEGATIVE NEGATIVE mg/dL   Protein, ur NEGATIVE NEGATIVE mg/dL   Urobilinogen, UA 0.2 0.0 - 1.0 mg/dL   Nitrite NEGATIVE NEGATIVE   Leukocytes, UA NEGATIVE NEGATIVE    Comment: MICROSCOPIC NOT DONE ON URINES WITH NEGATIVE PROTEIN, BLOOD, LEUKOCYTES, NITRITE, OR GLUCOSE <1000 mg/dL.  Pregnancy, urine POC     Status: None   Collection Time: 04/05/15 12:29 PM  Result Value Ref Range   Preg Test, Ur NEGATIVE NEGATIVE    Comment:        THE SENSITIVITY OF THIS METHODOLOGY IS >24 mIU/mL   Wet prep, genital     Status: Abnormal   Collection Time: 04/05/15  1:25 PM  Result Value Ref Range   Yeast Wet Prep HPF POC NONE SEEN NONE SEEN   Trich, Wet Prep NONE SEEN NONE SEEN   Clue Cells Wet Prep HPF POC MODERATE (A) NONE SEEN   WBC, Wet Prep HPF POC FEW (A) NONE SEEN    Comment: MANY BACTERIA SEEN  CBC     Status: None   Collection Time: 04/05/15  1:30 PM  Result Value Ref Range   WBC 5.4 4.0 - 10.5 K/uL   RBC 4.12 3.87 - 5.11 MIL/uL   Hemoglobin 13.2 12.0 - 15.0 g/dL   HCT 12.438.5 58.036.0 - 99.846.0 %   MCV 93.4 78.0 - 100.0 fL   MCH 32.0 26.0 - 34.0 pg   MCHC 34.3 30.0 - 36.0 g/dL   RDW 33.812.4 25.011.5 - 53.915.5 %   Platelets 293 150 - 400 K/uL    Review of Systems  Constitutional: Negative for fever.  Gastrointestinal: Positive for abdominal pain. Negative for diarrhea, constipation and anorexia.    Physical Exam   Blood pressure 103/64, pulse 72, resp. rate 18, height 5\' 6"  (1.676 m), weight 65.828 kg (145 lb 2 oz), last menstrual period 03/17/2015, SpO2 100 %.  Physical Exam  Constitutional: She is oriented to person, place, and time. She appears well-developed and well-nourished. No distress.  HENT:  Head: Normocephalic.  Eyes: Pupils are equal, round, and reactive to light.  Respiratory: Effort normal.  GI: Soft. She exhibits no distension. There is no tenderness. There is no rebound and no guarding.  Genitourinary:  Wet prep and GC collected by RN   Musculoskeletal: Normal range of motion.  Neurological: She is alert and oriented to person, place, and time.  Skin: Skin is warm. She is not diaphoretic.  Psychiatric: Her behavior is normal.    MAU Course  Procedures  None  MDM Wet prep GC  HIV CBC   Patient declined pain medication.   Assessment and Plan   A:  1. BV (bacterial vaginosis)   2. Abdominal pain in female patient      P:  Discharge home in stable condition  RX: Flagyl  Ok to try ibuprofen as directed on the bottle Return to MAU for emergencies only    Duane LopeJennifer I Rasch, NP 04/05/2015 2:11 PM

## 2015-04-05 NOTE — MAU Note (Signed)
Lower abd discomfort x 2-3 days, having slight vaginal itching, has white discharge - some odor.  Denies bleeding, nausea or vomiting.

## 2015-04-06 LAB — GC/CHLAMYDIA PROBE AMP (~~LOC~~) NOT AT ARMC
Chlamydia: NEGATIVE
NEISSERIA GONORRHEA: NEGATIVE

## 2015-04-06 LAB — HIV ANTIBODY (ROUTINE TESTING W REFLEX): HIV Screen 4th Generation wRfx: NONREACTIVE

## 2016-12-08 ENCOUNTER — Encounter (HOSPITAL_COMMUNITY): Payer: Self-pay
